# Patient Record
Sex: Female | Born: 1975 | Race: Black or African American | Hispanic: No | Smoking: Never smoker
Health system: Southern US, Community
[De-identification: ages and names within clinical notes are randomized; demographics above are authoritative.]

---

## 2012-09-15 ENCOUNTER — Emergency Department: Payer: Self-pay | Admitting: Emergency Medicine

## 2015-08-30 ENCOUNTER — Ambulatory Visit: Payer: 59 | Admitting: Family Medicine

## 2018-05-20 ENCOUNTER — Encounter: Payer: Self-pay | Admitting: *Deleted

## 2018-05-20 ENCOUNTER — Emergency Department: Payer: 59

## 2018-05-20 ENCOUNTER — Emergency Department
Admission: EM | Admit: 2018-05-20 | Discharge: 2018-05-21 | Disposition: A | Discharge status: Home / Self Care | Payer: 59 | Attending: Emergency Medicine | Admitting: Emergency Medicine

## 2018-05-20 ENCOUNTER — Other Ambulatory Visit: Payer: Self-pay

## 2018-05-20 DIAGNOSIS — H5440 Blindness, one eye, unspecified eye: Secondary | ICD-10-CM

## 2018-05-20 DIAGNOSIS — R4182 Altered mental status, unspecified: Secondary | ICD-10-CM | POA: Diagnosis present

## 2018-05-20 DIAGNOSIS — R200 Anesthesia of skin: Secondary | ICD-10-CM | POA: Insufficient documentation

## 2018-05-20 DIAGNOSIS — H53132 Sudden visual loss, left eye: Secondary | ICD-10-CM | POA: Diagnosis not present

## 2018-05-20 DIAGNOSIS — R410 Disorientation, unspecified: Secondary | ICD-10-CM | POA: Insufficient documentation

## 2018-05-20 LAB — BASIC METABOLIC PANEL
Anion gap: 8 (ref 5–15)
BUN: 12 mg/dL (ref 6–20)
CALCIUM: 9 mg/dL (ref 8.9–10.3)
CO2: 23 mmol/L (ref 22–32)
Chloride: 108 mmol/L (ref 101–111)
Creatinine, Ser: 0.76 mg/dL (ref 0.44–1.00)
GFR calc Af Amer: >60 mL/min (ref >60)
GLUCOSE: 99 mg/dL (ref 65–99)
Potassium: 4.1 mmol/L (ref 3.5–5.1)
Sodium: 139 mmol/L (ref 135–145)

## 2018-05-20 LAB — CBC
HCT: 39.3 % (ref 35.0–47.0)
Hemoglobin: 13.3 g/dL (ref 12.0–16.0)
MCH: 30.3 pg (ref 26.0–34.0)
MCHC: 34 g/dL (ref 32.0–36.0)
MCV: 89.3 fL (ref 80.0–100.0)
PLATELETS: 220 10*3/uL (ref 150–440)
RBC: 4.4 MIL/uL (ref 3.80–5.20)
RDW: 14 % (ref 11.5–14.5)
WBC: 4.5 10*3/uL (ref 3.6–11.0)

## 2018-05-20 LAB — URINE DRUG SCREEN, QUALITATIVE (ARMC ONLY)
Amphetamines, Ur Screen: NOT DETECTED
BARBITURATES, UR SCREEN: NOT DETECTED
BENZODIAZEPINE, UR SCRN: NOT DETECTED
COCAINE METABOLITE, UR ~~LOC~~: NOT DETECTED
Cannabinoid 50 Ng, Ur ~~LOC~~: NOT DETECTED
MDMA (Ecstasy)Ur Screen: NOT DETECTED
METHADONE SCREEN, URINE: NOT DETECTED
Opiate, Ur Screen: NOT DETECTED
Phencyclidine (PCP) Ur S: NOT DETECTED
TRICYCLIC, UR SCREEN: NOT DETECTED

## 2018-05-20 LAB — HEPATIC FUNCTION PANEL
ALBUMIN: 2 g/dL — AB (ref 3.5–5.0)
ALT: 15 U/L (ref 14–54)
AST: 40 U/L (ref 15–41)
Alkaline Phosphatase: 124 U/L (ref 38–126)
BILIRUBIN TOTAL: 0.3 mg/dL (ref 0.3–1.2)
Total Protein: 5.2 g/dL — ABNORMAL LOW (ref 6.5–8.1)

## 2018-05-20 LAB — ETHANOL: Alcohol, Ethyl (B): <10 mg/dL (ref <10)

## 2018-05-20 LAB — POCT PREGNANCY, URINE: Preg Test, Ur: NEGATIVE

## 2018-05-20 MED ORDER — LORAZEPAM 1 MG PO TABS
1.0000 mg | ORAL_TABLET | Freq: Once | ORAL | Status: AC
  Administered 2018-05-20: 1 mg via ORAL
  Filled 2018-05-20: qty 1

## 2018-05-20 MED ORDER — ASPIRIN 81 MG PO CHEW
324.0000 mg | CHEWABLE_TABLET | Freq: Once | ORAL | Status: AC
  Administered 2018-05-21: 324 mg via ORAL
  Filled 2018-05-20: qty 4

## 2018-05-20 MED ORDER — GADOBENATE DIMEGLUMINE 529 MG/ML IV SOLN: 10.0000 mL | Freq: Once | INTRAVENOUS | Status: DC | PRN

## 2018-05-20 NOTE — ED Triage Notes (Signed)
Pt brought in via ems with left eye blurred vision and heaviness on left side of face.  On arrival to er, sx resolved and pt crying.  Pt reports feeling stressed.

## 2018-05-20 NOTE — Consult Note (Signed)
[facility]   Asked to see this patient in telemedicine consultation. Consultation was performed with assistance of ancillary / medical staff at bedside. ?   Verbal consent to perform the examination with telemedicine was obtained. Patient and family agreed to proceed with the consultation.   Impression: Spell  Combination of symptoms including confusion/comprehension problems, L face numb, L eye visual disturbance  In setting of heightened stress  Non-focal exam currently  NIHSS 0  Not a tpa candidate due to:  Resolved symptoms  Not an NIR candidate due to:  Resolved symptoms  Differential Diagnosis:   Stroke  TIA  Seizure  Migraine  Stress/anxiety   Metrics:  STAT consult request  Recommendations:    Antiplatelet therapy with ASA OK  DVT Prophylaxis  Dysphagia Screen  MRI brain in ED  If negative study, then outpatient neuro referral  If positive study then admission for further work-up  Discussed with ED MD, they will call us back if MRI shows any concerning abnormality     CC:  Stroke Alert  Date of Consult:  05/20/18    HPI:  Elevated levels of stress recently at work  This was exacerbated with visit from her boss today  At about 1000 reports was reading at work and developed problems comprehending what she was reading  Felt confused/disoriented at times  Then noted L face numbness, ? Blurred vision OS  Tried to make tea when she got home, forgot to put water in tea [employer] a nap, and when up later in afternoon still some confusion, unsure of address or how she got home  To ER for further evaluation  In ER neuro exam normalized, and STAT neuro consult requested     No past medical history on file.  Prior to Admission medications   Not on File     No Known Allergies  Social History   Tobacco Use  . Smoking status: Never Smoker  . Smokeless tobacco: Never Used   Substance Use Topics  . Alcohol use: Not Currently  . Drug use: Never    No family history on file.      Physical Exam  Vitals:   05/20/18 1824  BP: (!) 133/93  Pulse: 100  Resp: 20  Temp: 98.6 F (37 C)  SpO2: 100%     NIHSS  LOC - 0  LOC questions - 0  LOC commands - 0  EOM - 0  VF - 0  Face - 0  Motor Upper Ext - 0  Motor Lower Ext - 0  Coordination - 0  Sensory - 0  Language - 0  Speech - 0  Extinction - 0  NIHSS total - 0   Lab/Data Review  CT Head - reviewed - no acute process      Medical Decision Making:  - Extensive number of diagnosis or management options are considered above.   - Extensive amount of complex data reviewed.   - High risk of complication and/or morbidity or mortality are associated with differential diagnostic considerations above.  - There may be Uncertain outcome and increased probability of prolonged functional impairment or high probability of severe prolonged functional impairment associated with some of these differential diagnosis.  Medical Data Reviewed:  1.Data reviewed include clinical labs, radiology,  Medical Tests;   2.Tests results discussed w/performing or interpreting physician;   3.Obtaining/reviewing old medical records;  4.Obtaining case history from another source;  5.Independent review of image, tracing or  specimen.

## 2018-05-20 NOTE — ED Notes (Signed)
Patient transported to MRI 

## 2018-05-20 NOTE — ED Provider Notes (Signed)
Patient's MRI was nondiagnostic, unable to perform with reasonable imaging due to some unremovable glued on hair clips for her leg.  The patient reports she is asymptomatic.  No ongoing symptoms.  I discussed with neurology, Dr. Myriam Jacobson, he recommends the patient may be discharged to utilize 325 mg aspirin daily.  Check with the patient, she is resting comfortably and she is agreeable with this plan.  No ongoing symptoms.  She will start aspirin daily, follow-up with outpatient neurology.  Return precautions and treatment recommendations and follow-up discussed with the patient who is agreeable with the plan.    Sharyn Creamer, MD 05/20/18 [phone]

## 2018-05-20 NOTE — ED Notes (Signed)
Report off to dejae rn  

## 2018-05-20 NOTE — Discharge Instructions (Addendum)
Please get plenty of rest, eat small healthy meals throughout the day, and drink plenty of fluid.  Please make a follow-up appointment with Dr. Malvin Johns, the neurologist on call.  Return to the emergency department if you develop severe headache, numbness tingling or weakness, difficulty walking, changes in vision, speech or mental status, or for any other symptoms concerning to you.  Take  of Aspirin by mouth daily (purchase at local pharmacy or grocer) and follow-up with neurology in the clinic (Dr. Sherryll Burger or Dr. Malvin Johns) as soon as posssible.

## 2018-05-20 NOTE — ED Notes (Signed)
Pt reports while at work today, pt had blurred vision in left eye and heaviness in left side of face.  No headache.  No n/v/d.  Pt also reports memory changes today but states now my memory is back.  Pt also states she has been stressed at work and is tearful now.  No chest pain or sob.  Pt alert.  Speech clear.

## 2018-05-20 NOTE — ED Provider Notes (Signed)
[facility] Emergency Department Provider Note  ____________________________________________  Time seen: Approximately 6:54 PM  I have reviewed the triage vital signs and the nursing notes.   HISTORY  Chief Complaint Altered Mental Status    HPI Stacey Hess is a 42 y.o. female, otherwise healthy, presenting for altered mental status and visual changes.  The patient reports that for the past several months she has been working with a very difficult client which has been very stressful.  She reports that for the past 2 weeks, she has had multiple headaches, which usually resolve with eating, drinking water, or rest.  She does not have a history of migraines.  Today, her boss flew into town and they were reviewing what to do with this client when the patient began to develop an inability to comprehend what she was reading.  She then fell up to complete loss of vision in the left eye which resolved.  She then developed left facial numbness, which has resolved.  She went home, and could not remember the name of her street.  When she arrived home, she plan to make tea but then forgot to put water in the pot.  Her symptoms did not improve after taking a nap.  She has not had any numbness tingling or weakness, speech changes, difficulty walking.  She has not recently had any trauma.  She denies drug abuse.  SH: Denies cocaine, illicit drug abuse, alcohol abuse.  No past medical history on file.  There are no active problems to display for this patient.       Allergies Patient has no known allergies.  No family history on file.  Social History Social History   Tobacco Use  . Smoking status: Never Smoker  . Smokeless tobacco: Never Used  Substance Use Topics  . Alcohol use: Not Currently  . Drug use: Never    Review of Systems Constitutional: No fever/chills.  Positive change in mental status. Eyes: Positive loss of vision in the left eye. ENT: No sore  throat. No congestion or rhinorrhea. Cardiovascular: Denies chest pain. Denies palpitations. Respiratory: Denies shortness of breath.  No cough. Gastrointestinal: No abdominal pain.  No nausea, no vomiting.  No diarrhea.  No constipation. Genitourinary: Negative for dysuria. Musculoskeletal: Negative for back pain. Skin: Negative for rash. Neurological: Negative for headaches.  Positive left facial numbness.  Positive difficulty understand what she is reading.  Positive altered mental status. Psychiatric:+ anxiety    ____________________________________________   PHYSICAL EXAM:  VITAL SIGNS: ED Triage Vitals  Enc Vitals Group     BP 05/20/18 1824 (!) 133/93     Pulse Rate 05/20/18 1824 100     Resp 05/20/18 1824 20     Temp 05/20/18 1824 98.6 F (37 C)     Temp Source 05/20/18 1824 Oral     SpO2 05/20/18 1824 100 %     Weight 05/20/18 1825 149 lb (67.6 kg)     Height 05/20/18 1825 5' (1.524 m)     Head Circumference --      Peak Flow --      Pain Score 05/20/18 1825 0     Pain Loc --      Pain Edu? --      Excl. in GC? --     Constitutional: Patient is tearful on my examination.  She is alert and oriented x3.  GCS is 15. Eyes: Conjunctivae are normal.  EOMI. No scleral icterus. Head: Atraumatic. Nose: No congestion/rhinnorhea. Mouth/Throat:  Mucous membranes are moist.  Neck: No stridor.  Supple.  No JVD.  No meningismus. Cardiovascular: Normal rate, regular rhythm. No murmurs, rubs or gallops.  Respiratory: Normal respiratory effort.  No accessory muscle use or retractions. Lungs CTAB.  No wheezes, rales or ronchi. Gastrointestinal: Soft, nontender and nondistended.  No guarding or rebound.  No peritoneal signs. Musculoskeletal: No LE edema. Neurologic: Alert and oriented 3. Speech is clear. Naming and repetition are intact. Face and smile symmetric. Tongue is midline.  EOMI.  PERRLA.  No vertical or horizontal nystagmus.. No pronator drift. 5 out of 5 grip, biceps,  triceps, hip flexors, plantar flexion and dorsiflexion. Normal sensation to light touch in the bilateral upper and lower extremities, and face. Normal heel-to-shin. Skin:  Skin is warm, dry and intact. No rash noted. Psychiatric: Tearful but no pressured speech.  Good insight.  ____________________________________________   LABS (all labs ordered are listed, but only abnormal results are displayed)  Labs Reviewed  HEPATIC FUNCTION PANEL - Abnormal; Notable for the following components:      Result Value   Total Protein 5.2 (*)    Albumin 2.0 (*)    Bilirubin, Direct <0.1 (*)    All other components within normal limits  CBC  BASIC METABOLIC PANEL  URINE DRUG SCREEN, QUALITATIVE ([facility] ONLY)  ETHANOL  POC URINE PREG, ED  POCT PREGNANCY, URINE   ____________________________________________  EKG  ED ECG REPORT I, Rockne Menghini, the attending physician, personally viewed and interpreted this ECG.   Date: 05/20/2018  EKG Time: 1841  Rate: 74  Rhythm: normal sinus rhythm  Axis: normal  Intervals:none  ST&T Change: No STEMI  ____________________________________________  RADIOLOGY  Ct Head Wo Contrast  Result Date: 05/20/2018 CLINICAL DATA:  Blurry vision at work today, LEFT facial heaviness. Transient memory loss. EXAM: CT HEAD WITHOUT CONTRAST TECHNIQUE: Contiguous axial images were obtained from the base of the skull through the vertex without intravenous contrast. COMPARISON:  None. FINDINGS: BRAIN: No intraparenchymal hemorrhage, mass effect nor midline shift. The ventricles and sulci are normal. No acute large vascular territory infarcts. No abnormal extra-axial fluid collections. Basal cisterns are patent. VASCULAR: Unremarkable. SKULL/SOFT TISSUES: No skull fracture. No significant soft tissue swelling. ORBITS/SINUSES: The included ocular globes and orbital contents are normal.The mastoid aircells and included paranasal sinuses are well-aerated. OTHER: None.  IMPRESSION: Normal noncontrast CT HEAD. Electronically Signed   By: Awilda Metro M.D.   On: 05/20/2018 19:17    ____________________________________________   PROCEDURES  Procedure(s) performed: None  Procedures  Critical Care performed: No ____________________________________________   INITIAL IMPRESSION / ASSESSMENT AND PLAN / ED COURSE  Pertinent labs & imaging results that were available during my care of the patient were reviewed by me and considered in my medical decision making (see chart for details).  42 y.o. female, otherwise healthy, presenting with an episode of difficulty with comprehension, memory loss, left facial numbness, and visual loss in the left eye, now all resolved.  On my examination, the patient is hemodynamically stable.  She does not have any focal neurologic deficits.  I am concerned about atypical migraine as the patient has been having symptoms for 2 weeks but the patient does not have a migrainous history.  In addition, will consider acute stroke which would be unlikely given her lack of risk factors.  Multiple sclerosis is also on the differential.  Anxiety from her stressful situation is also on the differential.  We will get basic laboratory studies, CT of the head, and  a neurology consult.  If the CT is negative, an MRI will follow.  ----------------------------------------- 7:08 PM on 05/20/2018 -----------------------------------------  I have spoken with the tele-neurologist on-call, who will also evaluate the patient.  If the patient's MRI is negative, anticipate discharge home with outpatient PCP and neurologic follow-up.  ----------------------------------------- 8:36 PM on 05/20/2018 -----------------------------------------  The patient has been signed out to Dr. Fanny Bien for f/u of MRI and final disposition.  ____________________________________________  FINAL CLINICAL IMPRESSION(S) / ED DIAGNOSES  Final diagnoses:  Disorientation   Left facial numbness  Blindness of left eye         NEW MEDICATIONS STARTED DURING THIS VISIT:  New Prescriptions   No medications on file      Rockne Menghini, MD 05/20/18 2037

## 2018-05-20 NOTE — ED Notes (Signed)
poct pregnancy Negative 

## 2018-06-08 ENCOUNTER — Emergency Department: Payer: 59

## 2018-06-08 ENCOUNTER — Emergency Department
Admission: EM | Admit: 2018-06-08 | Discharge: 2018-06-08 | Disposition: A | Discharge status: Home / Self Care | Payer: 59 | Attending: Emergency Medicine | Admitting: Emergency Medicine

## 2018-06-08 ENCOUNTER — Encounter: Payer: Self-pay | Admitting: Intensive Care

## 2018-06-08 ENCOUNTER — Other Ambulatory Visit: Payer: Self-pay

## 2018-06-08 DIAGNOSIS — R0789 Other chest pain: Secondary | ICD-10-CM | POA: Insufficient documentation

## 2018-06-08 DIAGNOSIS — R079 Chest pain, unspecified: Secondary | ICD-10-CM | POA: Diagnosis present

## 2018-06-08 LAB — BASIC METABOLIC PANEL
Anion gap: 5 (ref 5–15)
BUN: 8 mg/dL (ref 6–20)
CALCIUM: 8.4 mg/dL — AB (ref 8.9–10.3)
CO2: 20 mmol/L — AB (ref 22–32)
Chloride: 112 mmol/L — ABNORMAL HIGH (ref 101–111)
Creatinine, Ser: 0.69 mg/dL (ref 0.44–1.00)
GFR calc non Af Amer: >60 mL/min (ref >60)
Glucose, Bld: 99 mg/dL (ref 65–99)
Potassium: 3.8 mmol/L (ref 3.5–5.1)
SODIUM: 137 mmol/L (ref 135–145)

## 2018-06-08 LAB — CBC
HCT: 39 % (ref 35.0–47.0)
Hemoglobin: 13.3 g/dL (ref 12.0–16.0)
MCH: 30 pg (ref 26.0–34.0)
MCHC: 34 g/dL (ref 32.0–36.0)
MCV: 88.3 fL (ref 80.0–100.0)
Platelets: 211 10*3/uL (ref 150–440)
RBC: 4.41 MIL/uL (ref 3.80–5.20)
RDW: 14.1 % (ref 11.5–14.5)
WBC: 3.9 10*3/uL (ref 3.6–11.0)

## 2018-06-08 LAB — TROPONIN I: Troponin I: <0.03 ng/mL (ref <0.03)

## 2018-06-08 MED ORDER — IOPAMIDOL (ISOVUE-370) INJECTION 76%
75.0000 mL | Freq: Once | INTRAVENOUS | Status: AC | PRN
  Administered 2018-06-08: 75 mL via INTRAVENOUS

## 2018-06-08 NOTE — ED Notes (Signed)
Patient transported to CT 

## 2018-06-08 NOTE — Discharge Instructions (Signed)
Fortunately today your blood work and your CT scan were reassuring.  Please begin taking your baby aspirin every day and follow-up with primary care in 2 days for a recheck.  Return to the emergency department sooner for any concerns.  It was a pleasure to take care of you today, and thank you for coming to our emergency department.  If you have any questions or concerns before leaving please ask the nurse to grab me and I'm more than happy to go through your aftercare instructions again.  If you were prescribed any opioid pain medication today such as Norco, Vicodin, Percocet, morphine, hydrocodone, or oxycodone please make sure you do not drive when you are taking this medication as it can alter your ability to drive safely.  If you have any concerns once you are home that you are not improving or are in fact getting worse before you can make it to your follow-up appointment, please do not hesitate to call 911 and come back for further evaluation.  Merrily BrittleNeil Trev Boley, MD  Results for orders placed or performed during the hospital encounter of 06/08/18  Basic metabolic panel  Result Value Ref Range   Sodium 137 135 - 145 mmol/L   Potassium 3.8 3.5 - 5.1 mmol/L   Chloride 112 (H) 101 - 111 mmol/L   CO2 20 (L) 22 - 32 mmol/L   Glucose, Bld 99 65 - 99 mg/dL   BUN 8 6 - 20 mg/dL   Creatinine, Ser 1.470.69 0.44 - 1.00 mg/dL   Calcium 8.4 (L) 8.9 - 10.3 mg/dL   GFR calc non Af Amer >60 >60 mL/min   GFR calc Af Amer >60 >60 mL/min   Anion gap 5 5 - 15  CBC  Result Value Ref Range   WBC 3.9 3.6 - 11.0 K/uL   RBC 4.41 3.80 - 5.20 MIL/uL   Hemoglobin 13.3 12.0 - 16.0 g/dL   HCT 82.939.0 56.235.0 - 13.047.0 %   MCV 88.3 80.0 - 100.0 fL   MCH 30.0 26.0 - 34.0 pg   MCHC 34.0 32.0 - 36.0 g/dL   RDW 86.514.1 78.411.5 - 69.614.5 %   Platelets 211 150 - 440 K/uL  Troponin I  Result Value Ref Range   Troponin I <0.03 <0.03 ng/mL   Dg Chest 2 View  Result Date: 06/08/2018 CLINICAL DATA:  Chest pain EXAM: CHEST - 2 VIEW  COMPARISON:  None. FINDINGS: Lungs are clear. Heart size and pulmonary vascularity are normal. No adenopathy. No pneumothorax. No bone lesions. IMPRESSION: No edema or consolidation. Electronically Signed   By: Bretta BangWilliam  Woodruff III M.D.   On: 06/08/2018 10:51   Ct Head Wo Contrast  Result Date: 05/20/2018 CLINICAL DATA:  Blurry vision at work today, LEFT facial heaviness. Transient memory loss. EXAM: CT HEAD WITHOUT CONTRAST TECHNIQUE: Contiguous axial images were obtained from the base of the skull through the vertex without intravenous contrast. COMPARISON:  None. FINDINGS: BRAIN: No intraparenchymal hemorrhage, mass effect nor midline shift. The ventricles and sulci are normal. No acute large vascular territory infarcts. No abnormal extra-axial fluid collections. Basal cisterns are patent. VASCULAR: Unremarkable. SKULL/SOFT TISSUES: No skull fracture. No significant soft tissue swelling. ORBITS/SINUSES: The included ocular globes and orbital contents are normal.The mastoid aircells and included paranasal sinuses are well-aerated. OTHER: None. IMPRESSION: Normal noncontrast CT HEAD. Electronically Signed   By: Awilda Metroourtnay  Bloomer M.D.   On: 05/20/2018 19:17   Ct Angio Chest Pe W/cm &/or Wo Cm  Result Date: 06/08/2018 CLINICAL DATA:  Chest pain EXAM: CT ANGIOGRAPHY CHEST WITH CONTRAST TECHNIQUE: Multidetector CT imaging of the chest was performed using the standard protocol during bolus administration of intravenous contrast. Multiplanar CT image reconstructions and MIPs were obtained to evaluate the vascular anatomy. CONTRAST:  75mL ISOVUE-370 IOPAMIDOL (ISOVUE-370) INJECTION 76% COMPARISON:  Chest radiograph June 08, 2018 FINDINGS: Cardiovascular: There is no demonstrable pulmonary embolus. There is no thoracic aortic aneurysm or dissection. Visualized great vessels appear normal. Note that the right innominate and left common carotid arteries arise as a common trunk, an anatomic variant. There is no  pericardial effusion or pericardial thickening evident. Mediastinum/Nodes: Visualized thyroid appears unremarkable. There is no appreciable thoracic adenopathy. There is no appreciable esophageal lesion evident on this study. Lungs/Pleura: There is no appreciable edema or consolidation. No pleural effusion or pleural thickening evident. Upper Abdomen: Visualized upper abdominal structures appear unremarkable. Musculoskeletal: There are no blastic or lytic bone lesions. No chest wall lesions are evident. Review of the MIP images confirms the above findings. IMPRESSION: 1. No demonstrable pulmonary embolus. No thoracic aortic aneurysm or dissection. 2.  No lung edema or consolidation.  No pleural effusion. 3.  No evident thoracic adenopathy. Electronically Signed   By: Bretta Bang III M.D.   On: 06/08/2018 12:09   Mr Brain Wo Contrast  Result Date: 05/20/2018 CLINICAL DATA:  Altered mental status and visual changes, evaluate subacute neuro deficits. Transient memory loss and LEFT facial numbness. EXAM: MRI HEAD WITHOUT CONTRAST TECHNIQUE: Multiplanar, multiecho pulse sequences of the brain and surrounding structures were obtained without intravenous contrast. COMPARISON:  CT HEAD May 20, 2018 FINDINGS: Nondiagnostic examination due to susceptibility artifact related to patient's hair. Per technologist, these were unable to be removed. IMPRESSION: 1. Nondiagnostic MRI head. Repeat examination could be performed after removal of metallic foreign bodies within patient's hair. Electronically Signed   By: Awilda Metro M.D.   On: 05/20/2018 21:35

## 2018-06-08 NOTE — ED Triage Notes (Signed)
[facility] Patient was with client and started having heavy midsternal chest pain that radiates down L arm. EMS administered aspirin. Blood sugar from EMS 119, b/p 132/79, 100 O2, pulse 86. Patient A&O x4. HX TIA X3 weeks ago

## 2018-06-08 NOTE — ED Provider Notes (Signed)
[facility] Emergency Department Provider Note  ____________________________________________   First MD Initiated Contact with Patient 06/08/18 1107     (approximate)  I have reviewed the triage vital signs and the nursing notes.   HISTORY  Chief Complaint Chest Pain    HPI Grace BlightKarema Garzon is a 42 y.o. female who comes to the emergency department via EMS with multiple issues.  She said that while at work today she was walking and she had sudden onset moderate severity substernal chest pain that seems to radiate towards her back and then towards her right arm.  The pain lasted for several minutes at a time however the numbness and tingling in her right arm persisted.  She is currently in no pain.  She denies headache.  She was recently diagnosed with a TIA 3 weeks ago however has been noncompliant with her aspirin taking over-the-counter vitamins and herbs instead.  She currently takes no medications.  Her pain was not ripping or tearing.  She did have some mild shortness of breath.  No recent surgery travel or immobilization.  No history of DVT or pulmonary embolism but she has reported recent "swelling and tingling" in her right leg.  History reviewed. No pertinent past medical history.  There are no active problems to display for this patient.   History reviewed. No pertinent surgical history.  Prior to Admission medications   Not on File    Allergies Patient has no known allergies.  History reviewed. No pertinent family history.  Social History Social History   Tobacco Use  . Smoking status: Never Smoker  . Smokeless tobacco: Never Used  Substance Use Topics  . Alcohol use: Not Currently  . Drug use: Never    Review of Systems Constitutional: No fever/chills Eyes: No visual changes. ENT: No sore throat. Cardiovascular: Positive for chest pain. Respiratory: Denies shortness of breath. Gastrointestinal: No abdominal pain.  No nausea, no  vomiting.  No diarrhea.  No constipation. Genitourinary: Negative for dysuria. Musculoskeletal: Negative for back pain. Skin: Negative for rash. Neurological: Negative for headaches  ____________________________________________   PHYSICAL EXAM:  VITAL SIGNS: ED Triage Vitals  Enc Vitals Group     BP 06/08/18 1037 121/75     Pulse Rate 06/08/18 1037 70     Resp 06/08/18 1037 10     Temp 06/08/18 1037 98.5 F (36.9 C)     Temp Source 06/08/18 1037 Oral     SpO2 06/08/18 1037 99 %     Weight 06/08/18 1034 149 lb (67.6 kg)     Height 06/08/18 1034 5' (1.524 m)     Head Circumference --      Peak Flow --      Pain Score 06/08/18 1034 3     Pain Loc --      Pain Edu? --      Excl. in GC? --     Constitutional: Alert and oriented x4 somewhat anxious appearing nontoxic no diaphoresis speaks in full clear sentences Eyes: PERRL EOMI. Head: Atraumatic. Nose: No congestion/rhinnorhea. Mouth/Throat: No trismus Neck: No stridor.   Cardiovascular: Normal rate, regular rhythm. Grossly normal heart sounds.  Good peripheral circulation. Respiratory: Normal respiratory effort.  No retractions. Lungs CTAB and moving good air Gastrointestinal: Soft nontender Musculoskeletal: No lower extremity edema   Neurologic:  Normal speech and language. No gross focal neurologic deficits are appreciated. Skin:  Skin is warm, dry and intact. No rash noted. Psychiatric: Anxious appearing    ____________________________________________  DIFFERENTIAL includes but not limited to  Acute coronary syndrome, pulmonary embolism, aortic dissection ____________________________________________   LABS (all labs ordered are listed, but only abnormal results are displayed)  Labs Reviewed  BASIC METABOLIC PANEL - Abnormal; Notable for the following components:      Result Value   Chloride 112 (*)    CO2 20 (*)    Calcium 8.4 (*)    All other components within normal limits  CBC  TROPONIN I  POC  URINE PREG, ED    Lab work reviewed by me with no acute ischemia noted CT chest reviewed by me with no dissection and no pulmonary embolism __________________________________________  EKG  ED ECG REPORT I, Merrily Brittle, the attending physician, personally viewed and interpreted this ECG.  Date: 06/08/2018 EKG Time:  Rate: 75 Rhythm: normal sinus rhythm QRS Axis: normal Intervals: normal ST/T Wave abnormalities: normal Narrative Interpretation: no evidence of acute ischemia  ____________________________________________  RADIOLOGY  Chest x-ray reviewed by me with no acute disease ____________________________________________   PROCEDURES  Procedure(s) performed: no  Procedures  Critical Care performed: no  Observation: no ____________________________________________   INITIAL IMPRESSION / ASSESSMENT AND PLAN / ED COURSE  Pertinent labs & imaging results that were available during my care of the patient were reviewed by me and considered in my medical decision making (see chart for details).  Unclear etiology of the patient's symptoms.  Exertional substernal chest pain certainly is concerning and I think she warrants at least 2 troponins that she came in immediately.  Given her leg swelling and neuro findings along with some shortness of breath I think a CT angios was also warranted which would evaluate for dissection and pulmonary embolism.     ----------------------------------------- 12:30 PM on 06/08/2018 -----------------------------------------  Fortunately the patient's imaging is negative and her troponin is negative as well.  She feels significant relief.  She says that she has been under tremendous amount of stress at work and she is concerned this could be an anxiety reaction which I actually agree with.  She does have the ability to follow-up with her primary care physician this coming week for recheck.  She is discharged home in improved condition  verbalizes understanding agree with the plan with strict return precautions given. ____________________________________________   FINAL CLINICAL IMPRESSION(S) / ED DIAGNOSES  Final diagnoses:  Atypical chest pain      NEW MEDICATIONS STARTED DURING THIS VISIT:  New Prescriptions   No medications on file     Note:  This document was prepared using Dragon voice recognition software and may include unintentional dictation errors.     Merrily Brittle, MD 06/08/18 1231

## 2018-06-08 NOTE — ED Notes (Signed)
Patient transported to X-ray 

## 2018-12-29 ENCOUNTER — Emergency Department
Admission: EM | Admit: 2018-12-29 | Discharge: 2018-12-29 | Disposition: A | Discharge status: Home / Self Care | Payer: 59 | Attending: Emergency Medicine | Admitting: Emergency Medicine

## 2018-12-29 ENCOUNTER — Other Ambulatory Visit: Payer: Self-pay

## 2018-12-29 ENCOUNTER — Encounter: Payer: Self-pay | Admitting: Emergency Medicine

## 2018-12-29 DIAGNOSIS — K0889 Other specified disorders of teeth and supporting structures: Secondary | ICD-10-CM | POA: Insufficient documentation

## 2018-12-29 MED ORDER — IBUPROFEN 600 MG PO TABS: 600.0000 mg | ORAL_TABLET | Freq: Three times a day (TID) | ORAL | 0 refills | Status: AC | PRN

## 2018-12-29 MED ORDER — BUPIVACAINE HCL (PF) 0.5 % IJ SOLN
30.0000 mL | Freq: Once | INTRAMUSCULAR | Status: AC
  Administered 2018-12-29: 30 mL
  Filled 2018-12-29: qty 30

## 2018-12-29 MED ORDER — OXYCODONE-ACETAMINOPHEN 5-325 MG PO TABS
1.0000 | ORAL_TABLET | ORAL | Status: DC | PRN
  Administered 2018-12-29: 1 via ORAL
  Filled 2018-12-29: qty 1

## 2018-12-29 MED ORDER — PENICILLIN V POTASSIUM 500 MG PO TABS: 500.0000 mg | ORAL_TABLET | Freq: Four times a day (QID) | ORAL | 0 refills | Status: AC

## 2018-12-29 MED ORDER — IBUPROFEN 600 MG PO TABS
600.0000 mg | ORAL_TABLET | Freq: Once | ORAL | Status: AC
  Administered 2018-12-29: 600 mg via ORAL
  Filled 2018-12-29: qty 1

## 2018-12-29 MED ORDER — OXYCODONE-ACETAMINOPHEN 5-325 MG PO TABS: 1.0000 | ORAL_TABLET | ORAL | 0 refills | Status: AC | PRN

## 2018-12-29 NOTE — ED Notes (Addendum)
Pt to the er for upper left tooth pain for 5 hours. Pt states she went to the MD two months ago and she was supposed to have surgery but did not. Pt then reports dizziness. Pt was brought to room in a wheelchair.

## 2018-12-29 NOTE — ED Provider Notes (Signed)
[facility] Emergency Department Provider Note  ____________________________________________   First MD Initiated Contact with Patient 12/29/18 [phone]     (approximate)  I have reviewed the triage vital signs and the nursing notes.   HISTORY  Chief Complaint Dental Pain    HPI Stacey Hess is a 43 y.o. female is L presents to the emergency department with sudden onset severe left upper dental pain that began acutely 5 hours ago.  She has had chronic issues with her teeth and is scheduled to follow-up with a dentist later on this week.  She is had no shortness of breath.  No drooling.  No difficulty eating or drinking although cold certainly makes her symptoms worse.  No fevers or chills.   History reviewed. No pertinent past medical history.  There are no active problems to display for this patient.   History reviewed. No pertinent surgical history.  Prior to Admission medications   Medication Sig Start Date End Date Taking? Authorizing Provider  ibuprofen (ADVIL,MOTRIN) 600 MG tablet Take 1 tablet (600 mg total) by mouth every 8 (eight) hours as needed. 12/29/18   Merrily Brittle, MD  oxyCODONE-acetaminophen (PERCOCET/ROXICET) 5-325 MG tablet Take 1 tablet by mouth every 4 (four) hours as needed for severe pain. 12/29/18   Merrily Brittle, MD  penicillin v potassium (VEETID) 500 MG tablet Take 1 tablet (500 mg total) by mouth 4 (four) times daily for 10 days. 12/29/18 01/08/19  Merrily Brittle, MD    Allergies Peanut-containing drug products  No family history on file.  Social History Social History   Tobacco Use  . Smoking status: Never Smoker  . Smokeless tobacco: Never Used  Substance Use Topics  . Alcohol use: Not Currently  . Drug use: Never    Review of Systems Constitutional: No fever/chills ENT: Positive for dental pain Cardiovascular: Denies chest pain. Respiratory: Denies shortness of breath. Gastrointestinal: No abdominal pain.  No  nausea, no vomiting.   Neurological: Negative for headaches   ____________________________________________   PHYSICAL EXAM:  VITAL SIGNS: ED Triage Vitals  Enc Vitals Group     BP 12/29/18 0131 (!) 122/91     Pulse Rate 12/29/18 0131 85     Resp 12/29/18 0131 20     Temp 12/29/18 0131 98.6 F (37 C)     Temp Source 12/29/18 0131 Oral     SpO2 12/29/18 0131 100 %     Weight 12/29/18 0133 139 lb (63 kg)     Height 12/29/18 0133 5\' 1"  (1.549 m)     Head Circumference --      Peak Flow --      Pain Score 12/29/18 0133 10     Pain Loc --      Pain Edu? --      Excl. in GC? --     Constitutional: Alert and oriented x4 tearful and miserable appearing Head: Atraumatic. Nose: No congestion/rhinnorhea. Mouth/Throat: No trismus uvula midline no pharyngeal erythema or exudate.  Left upper posterior molar cracked and exquisitely tender to percussion.  No sublingual or submental tenderness or fullness.  No evidence of posterior infection Neck: No stridor.   Respiratory: Normal respiratory effort.  No retractions. Neurologic:  Normal speech and language. No gross focal neurologic deficits are appreciated.  Skin:  Skin is warm, dry and intact. No rash noted.    ____________________________________________  LABS (all labs ordered are listed, but only abnormal results are displayed)  Labs Reviewed - No data to display  __________________________________________  EKG   ____________________________________________  RADIOLOGY   ____________________________________________   DIFFERENTIAL includes but not limited to  Ludwig's angina, pericarditis, dental infection, broken tooth   PROCEDURES  Procedure(s) performed: Yes  .Nerve Block Date/Time: 12/29/2018 3:30 AM Performed by: Merrily Brittle, MD Authorized by: Merrily Brittle, MD   Consent:    Consent obtained:  Verbal   Consent given by:  Patient   Risks discussed:  Allergic reaction, bleeding, intravenous  injection, pain and unsuccessful block   Alternatives discussed:  Alternative treatment Indications:    Indications:  Pain relief Location:    Body area:  Head   Head nerve blocked: Left infra orbital nerve block.   Laterality:  Left Skin anesthesia (see MAR for exact dosages):    Skin anesthesia method:  None Procedure details (see MAR for exact dosages):    Block needle gauge:  25 G   Anesthetic injected:  Bupivacaine 0.5% w/o epi   Steroid injected:  None   Additive injected:  None   Injection procedure:  Anatomic landmarks identified, anatomic landmarks palpated, negative aspiration for blood and incremental injection   Paresthesia:  Immediately resolved Post-procedure details:    Dressing:  None   Outcome:  Pain relieved Comments:     Total of 3 cc used to and and infraorbital nerve block and 1 directly at the base of the tooth    Critical Care performed: no  ____________________________________________   INITIAL IMPRESSION / ASSESSMENT AND PLAN / ED COURSE  Pertinent labs & imaging results that were available during my care of the patient were reviewed by me and considered in my medical decision making (see chart for details).   As part of my medical decision making, I reviewed the following data within the electronic MEDICAL RECORD NUMBER History obtained from family if available, nursing notes, old chart and ekg, as well as notes from prior ED visits.  The patient comes to the emergency department with acute exacerbation of chronic dental pain.  I performed a dental block with near complete anesthesia.  I will prescribe her a short course of ibuprofen, Percocet, and penicillin VK and refer her back to the dentist.  No evidence of posterior involvement or Ludwig angina etc.  She was able to eat and drink prior to discharge.      ____________________________________________   FINAL CLINICAL IMPRESSION(S) / ED DIAGNOSES  Final diagnoses:  Pain, dental      NEW  MEDICATIONS STARTED DURING THIS VISIT:  Discharge Medication List as of 12/29/2018  3:27 AM    START taking these medications   Details  ibuprofen (ADVIL,MOTRIN) 600 MG tablet Take 1 tablet (600 mg total) by mouth every 8 (eight) hours as needed., Starting Wed 12/29/2018, Print    oxyCODONE-acetaminophen (PERCOCET/ROXICET) 5-325 MG tablet Take 1 tablet by mouth every 4 (four) hours as needed for severe pain., Starting Wed 12/29/2018, Print    penicillin v potassium (VEETID) 500 MG tablet Take 1 tablet (500 mg total) by mouth 4 (four) times daily for 10 days., Starting Wed 12/29/2018, Until Sat 01/08/2019, Print         Note:  This document was prepared using Dragon voice recognition software and may include unintentional dictation errors.     Merrily Brittle, MD 01/01/19 [phone]

## 2018-12-29 NOTE — ED Triage Notes (Signed)
Patient to ER for dental pain. Patient tearful in triage, states she has exposed tooth. Was seen at dentist and given 2 medications (Prednisone and unknown second medication). Patient states Prednisone made her heart feel like it was racing, and second medicine "made me feel like I was going to die".

## 2020-04-12 IMAGING — CT CT ANGIO CHEST
2 of 6 series · 18 of 46 positions shown · IV contrast (APPLIED)
Comparison: Chest radiograph June 08, 2018

CLINICAL DATA: Chest pain

EXAM:
CT ANGIOGRAPHY CHEST WITH CONTRAST
TECHNIQUE: Multidetector CT imaging of the chest was performed using the
standard protocol during bolus administration of intravenous
contrast. Multiplanar CT image reconstructions and MIPs were
obtained to evaluate the vascular anatomy.
CONTRAST:  75mL [id number] IOPAMIDOL ([id number]) INJECTION 76%

[Series 5: thins · axial · 0.60mm/px · z∈[-258,-58]mm · 15 of 220 slices shown]
[im 10/220  lung]
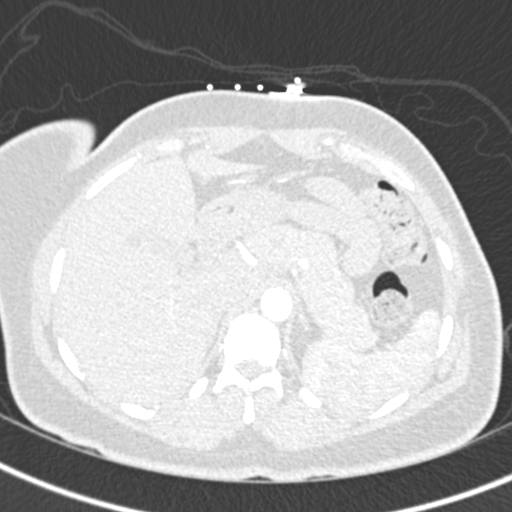
[im 29/220  soft-tissue]
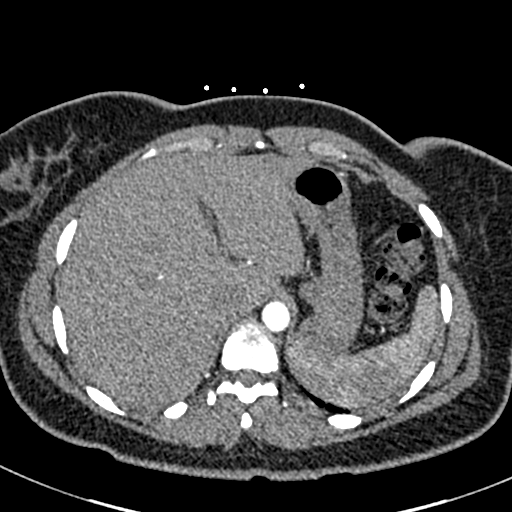
[im 39/220  lung]
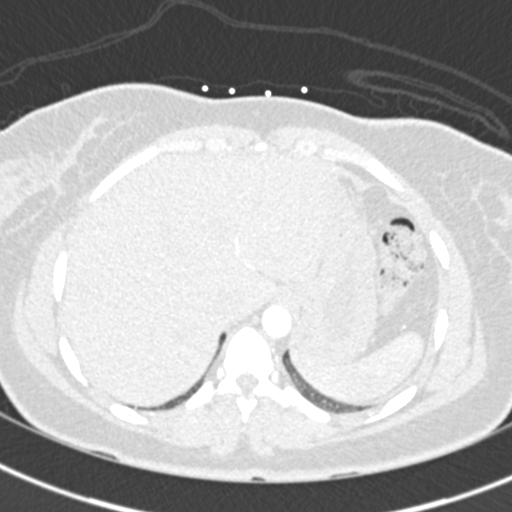
[im 58/220  soft-tissue]
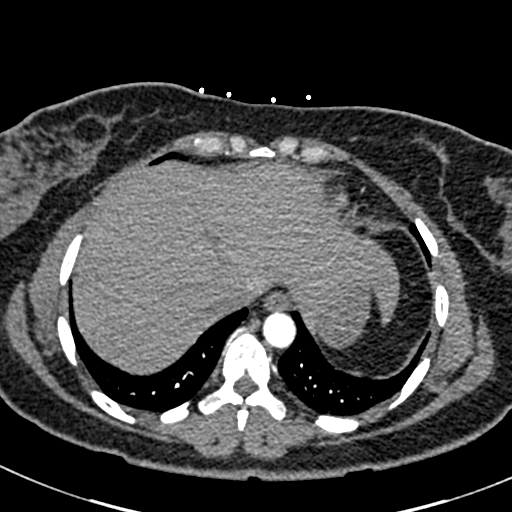
[im 67/220  lung]
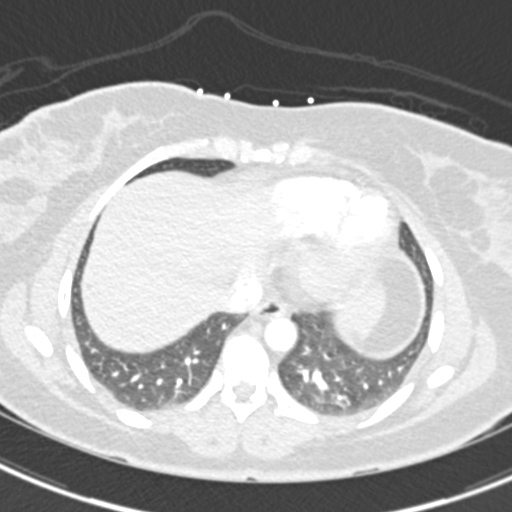
[im 86/220  soft-tissue]
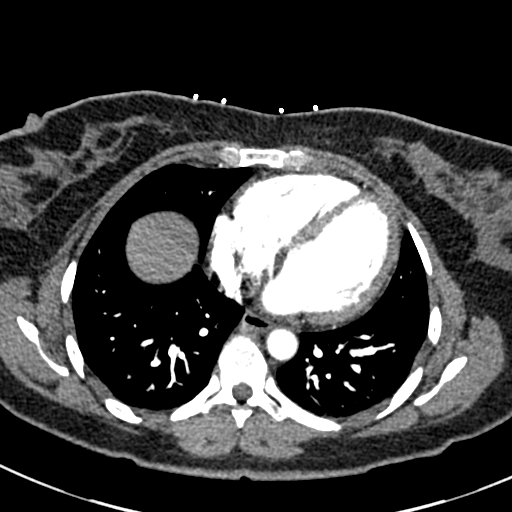
[im 96/220  lung]
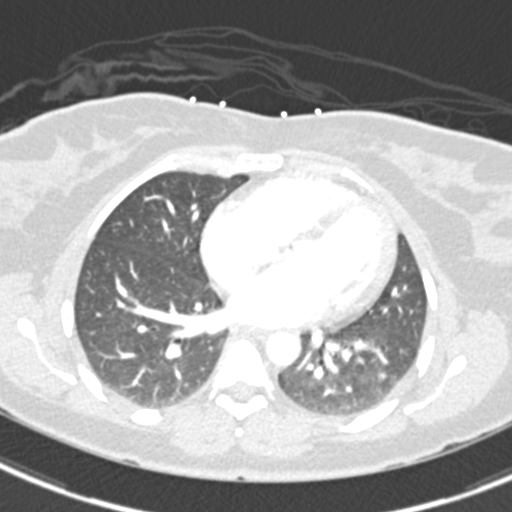
[im 115/220  soft-tissue]
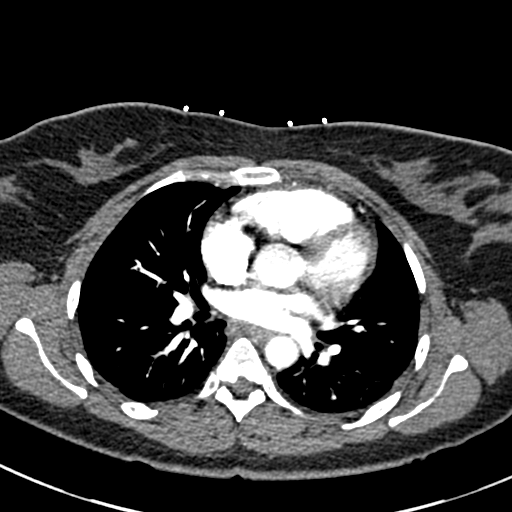
[im 124/220  lung]
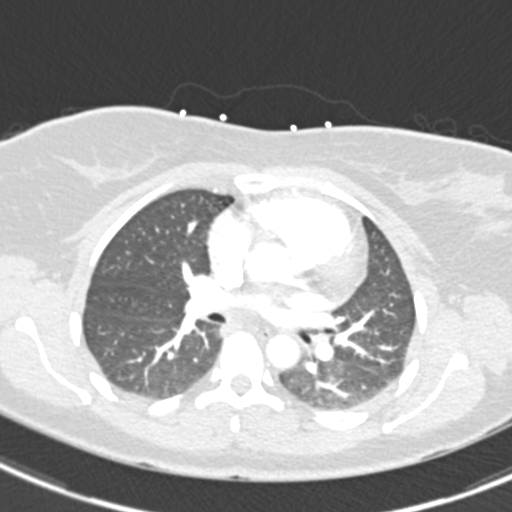
[im 134/220  soft-tissue]
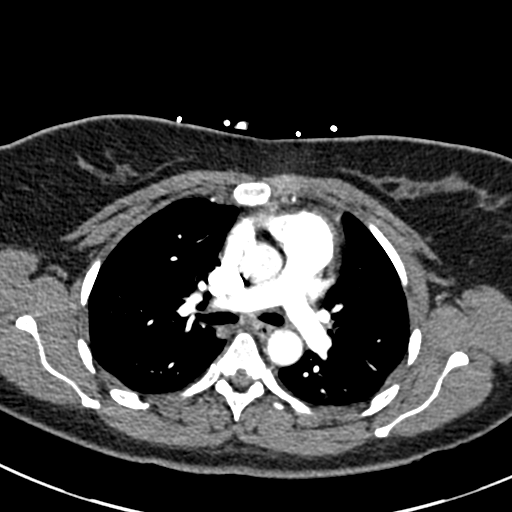
[im 153/220  lung]
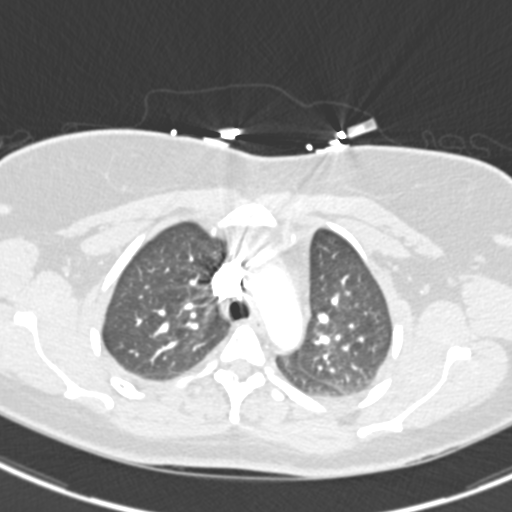
[im 162/220  soft-tissue]
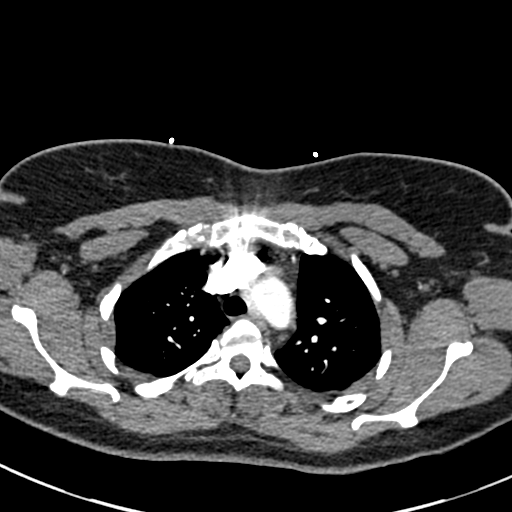
[im 181/220  lung]
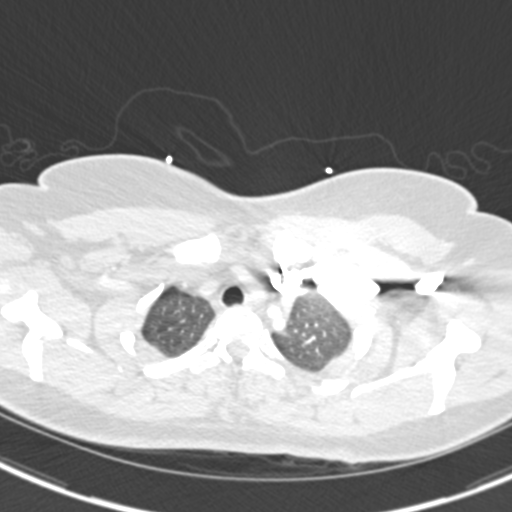
[im 191/220  soft-tissue]
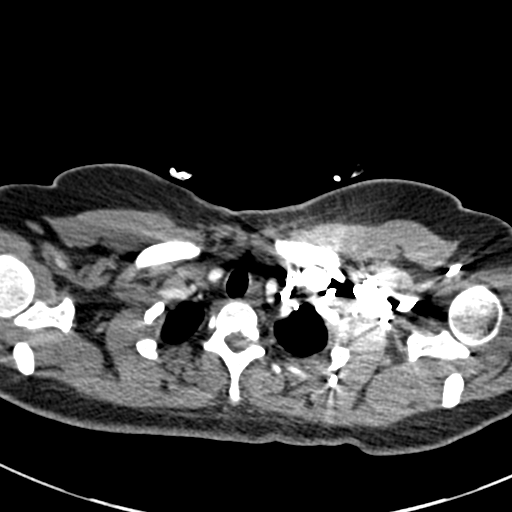
[im 210/220  lung]
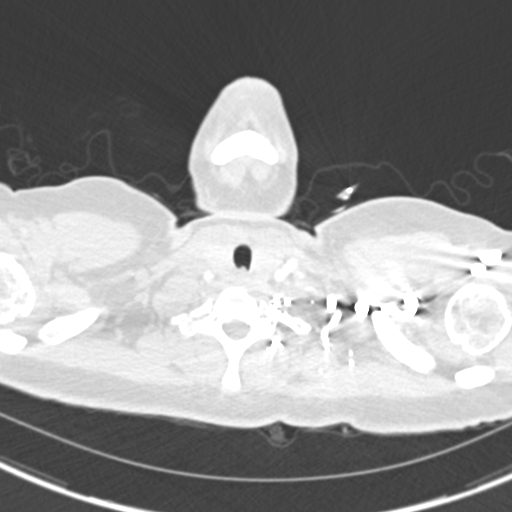

[Series 7: coronal mpr · coronal · 0.45mm/px · 3 of 71 slices shown]
[im 18/71  soft-tissue]
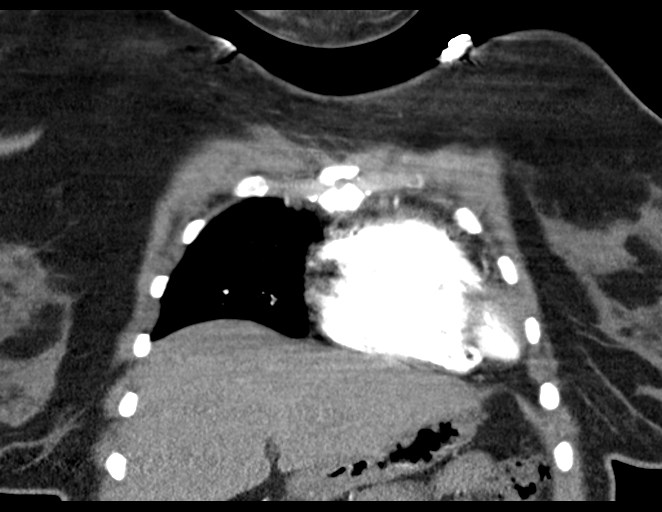
[im 36/71  soft-tissue]
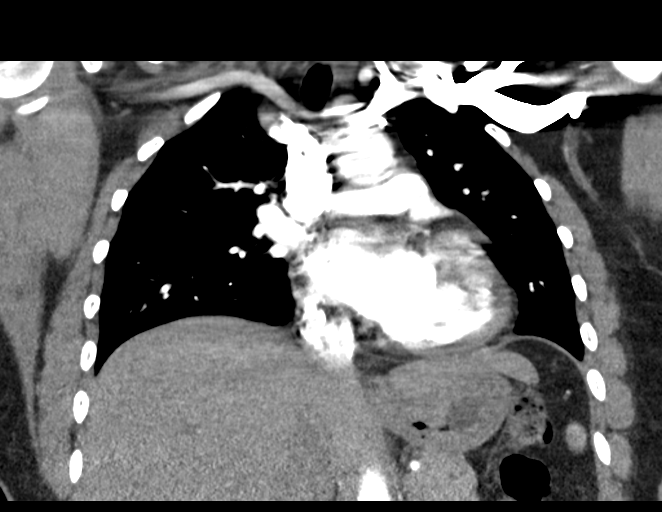
[im 53/71  soft-tissue]
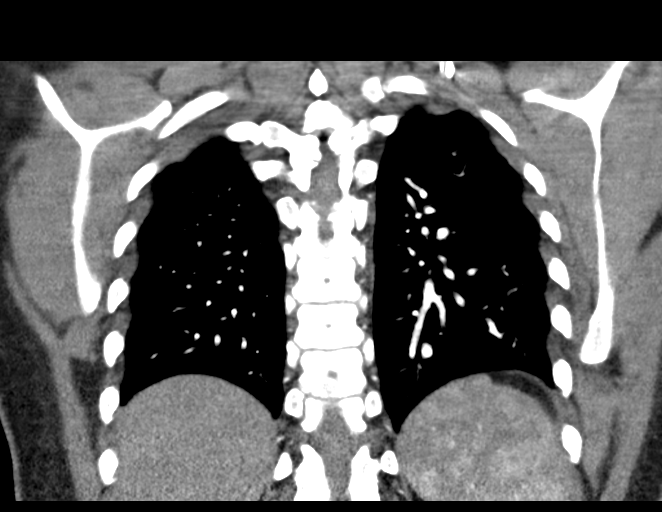

[18 of 46 positions shown; findings below may reference images not displayed]

FINDINGS: Cardiovascular: There is no demonstrable pulmonary embolus. There is
no thoracic aortic aneurysm or dissection. Visualized great vessels
appear normal. Note that the right innominate and left common
carotid arteries arise as a common trunk, an anatomic variant. There
is no pericardial effusion or pericardial thickening evident.

Mediastinum/Nodes: Visualized thyroid appears unremarkable. There is
no appreciable thoracic adenopathy. There is no appreciable
esophageal lesion evident on this study.

Lungs/Pleura: There is no appreciable edema or consolidation. No
pleural effusion or pleural thickening evident.

Upper Abdomen: Visualized upper abdominal structures appear
unremarkable.

Musculoskeletal: There are no blastic or lytic bone lesions. No
chest wall lesions are evident.

Review of the MIP images confirms the above findings.
IMPRESSION: 1. No demonstrable pulmonary embolus. No thoracic aortic aneurysm or
dissection.

2.  No lung edema or consolidation.  No pleural effusion.

3.  No evident thoracic adenopathy.

## 2020-08-28 ENCOUNTER — Ambulatory Visit: Payer: Self-pay

## 2020-08-29 ENCOUNTER — Ambulatory Visit: Payer: Self-pay

## 2020-11-14 ENCOUNTER — Other Ambulatory Visit: Payer: Self-pay

## 2020-11-14 ENCOUNTER — Ambulatory Visit
Admission: EM | Admit: 2020-11-14 | Discharge: 2020-11-14 | Disposition: A | Discharge status: Other Acute Inpatient Hospital | Payer: Self-pay | Attending: Emergency Medicine | Admitting: Emergency Medicine

## 2020-11-14 ENCOUNTER — Encounter: Payer: Self-pay | Admitting: Emergency Medicine

## 2020-11-14 DIAGNOSIS — M79601 Pain in right arm: Secondary | ICD-10-CM | POA: Insufficient documentation

## 2020-11-14 DIAGNOSIS — M79651 Pain in right thigh: Secondary | ICD-10-CM | POA: Insufficient documentation

## 2020-11-14 DIAGNOSIS — R079 Chest pain, unspecified: Secondary | ICD-10-CM | POA: Insufficient documentation

## 2020-11-14 LAB — GLUCOSE, CAPILLARY: Glucose-Capillary: 86 mg/dL (ref 70–99)

## 2020-11-14 NOTE — ED Triage Notes (Signed)
Patient c/o right arm pain, shortness of breath and chest pain that happened x 1 this morning. She states she ignored the pain as it only lasted about 1 minute. She states it happened again, and then this evening it happened while she was driving. She states now she has right leg pain, right arm pain and shortness of breath. She stated the shortness of breath was so bad she could not walk to the stairs. She didn't think she could drive to the ER so she came to [facility].

## 2020-11-14 NOTE — ED Provider Notes (Signed)
HPI  SUBJECTIVE:  Stacey Hess is a 44 y.o. female who presents with a acute onset of severe, deep squeezing medial right thigh pain that traveled down to her foot, and subsequently shot up into her chest and right arm.  She reports squeezing chest pain with shortness of breath and an impending sense of doom.  The lasted approximately a minute and resolved.  This occurred while shopping.  It was not during heavy exertion.  She states that she had another episode of similar pain that lasted about 4 minutes while driving.  Patient states that she was unable to make it to the ED and so came to the urgent care for evaluation.  She reports a squeezing, pulling abdominal pain as well.  She states that she felt faint with the arm and chest pain.  She currently denies right arm or chest pain, but reports some residual right medial thigh pain.  No leg swelling, known extremity color changes.  No syncope, palpitations, nausea, diaphoresis.  No recent prolonged immobilization, surgery in the past 4 weeks, hemoptysis.  No exertional component to this pain.  She has not tried anything for this.  Symptoms are better with sitting, standing, bending forward and applying pressure to her thigh.  No aggravating factors.  She denies any change in physical activity or trauma to her chest or leg.  She has a past medical history of TIA and is not on any aspirin.  She denies history of aortic dissection, PE, DVT, OCP use, cancer, atrial fibrillation, Marfan's, Ehlers-Danlos or other connective tissue disease, GERD, peripheral arterial disease, peripheral vascular disease.  Family history significant for mother with 6 MIs, fatal at 64.  She has had multiple maternal aunts with MIs.  States that she had maternal aunts with recurrent DVT.  PMD: None.  Patient was seen in the ED on 05/2018 for substernal exertional chest pain that radiated to her back and right arm and leg pain/shortness of breath.  She had negative CTA-P for  dissection or PE, had 2 normal troponins.  She states this feels somewhat similar to that pain.  Her primary concern is that she is having a blood clot or heart attack.  States that she is not under any amount of stress in her life right now.  He also states that she recently finished a 7-day fast and is wondering if this could be due to hypoglycemia.   History reviewed. No pertinent past medical history.  History reviewed. No pertinent surgical history.  Family History  Problem Relation Age of Onset  . Coronary artery disease Mother   . Healthy Father     Social History   Tobacco Use  . Smoking status: Never Smoker  . Smokeless tobacco: Never Used  Substance Use Topics  . Alcohol use: Not Currently  . Drug use: Never    No current facility-administered medications for this encounter.  Current Outpatient Medications:  .  ibuprofen (ADVIL,MOTRIN) 600 MG tablet, Take 1 tablet (600 mg total) by mouth every 8 (eight) hours as needed., Disp: 30 tablet, Rfl: 0 .  oxyCODONE-acetaminophen (PERCOCET/ROXICET) 5-325 MG tablet, Take 1 tablet by mouth every 4 (four) hours as needed for severe pain., Disp: 7 tablet, Rfl: 0  Allergies  Allergen Reactions  . Peanut-Containing Drug Products Anaphylaxis     ROS  As noted in HPI.   Physical Exam  BP 112/82 (BP Location: Right Arm)   Pulse 86   Resp 18   Ht 5\' 1"  (1.549 m)  Wt 64.4 kg   LMP 10/31/2020   SpO2 100%   BMI 26.83 kg/m   Constitutional: Well developed, well nourished, no acute distress Eyes: PERRL, EOMI, conjunctiva normal bilaterally HENT: Normocephalic, atraumatic,mucus membranes moist Respiratory: Clear to auscultation bilaterally, no rales, no wheezing, no rhonchi.  Positive right-sided chest wall tenderness. Cardiovascular: Normal rate and rhythm, no murmurs, no gallops, no rubs.  RP 2+ and equal bilaterally.  Present femoral pulses. GI: Mild periumbilical tenderness.  No palpable pulsatile abdominal masses.   Soft, nondistended, normal bowel sounds, nontender, no rebound, no guarding Back: no CVAT skin: No rash, skin intact Musculoskeletal: Tenderness right medial thigh.  No appreciable cord.  No distal edema.  No calf tenderness.  Calves symmetric.  DP right lower extremity 2+. Neurologic: Alert & oriented x 3, CN III-XII grossly intact, no motor deficits, sensation grossly intact Psychiatric: Speech and behavior appropriate   ED Course   Medications - No data to display  Orders Placed This Encounter  Procedures  . Glucose, capillary    Standing Status:   Standing    Number of Occurrences:   1  . CBG monitoring, ED    Standing Status:   Standing    Number of Occurrences:   1  . EKG 12-Lead    Standing Status:   Standing    Number of Occurrences:   1  . ED EKG    Standing Status:   Standing    Number of Occurrences:   1    Order Specific Question:   Reason for Exam    Answer:   Chest Pain   Results for orders placed or performed during the hospital encounter of 11/14/20 (from the past 24 hour(s))  Glucose, capillary     Status: None   Collection Time: 11/14/20  7:22 PM  Result Value Ref Range   Glucose-Capillary 86 70 - 99 mg/dL   No results found.  ED Clinical Impression  1. Chest pain, unspecified type   2. Right thigh pain   3. Right arm pain      ED Assessment/Plan  Previous ER notes, labs reviewed.  As noted in HPI.  EKG: Normal sinus rhythm, rate 62.  Normal axis, normal intervals.  No hypertrophy.  No ST-T wave changes.  No change compared to EKG from 06/09/2018.  Patient was asymptomatic while EKG was obtained.  Patient with severe leg pain, chest pain and arm pain.  She has multiple concerning historical factors.  Her EKG is normal, but she was asymptomatic while the EKG was obtained.  She is not tachycardic which would be concerning for PE, however given her medial thigh tenderness that traveled to the chest DVT/PE is in the differential.  Also consider aortic  dissection.  Transferring to the [facility] emergency department via EMS for cardiac monitoring on route and in case this happens again.  I do not feel that the patient would be able to drive should she have another episode of this.  She lives by herself here and does not have anyone to drive her.  Her vitals are otherwise normal.  Glucose is normal.  Discussed MDM, rationale for transfer to the emergency department with the patient. patient agrees with plan.   No orders of the defined types were placed in this encounter.   *This clinic note was created using Dragon dictation software. Therefore, there may be occasional mistakes despite careful proofreading.  ?    Domenick Gong, MD 11/14/20 2008

## 2022-02-13 ENCOUNTER — Encounter: Payer: Self-pay | Admitting: *Deleted

## 2022-02-13 ENCOUNTER — Emergency Department
Admission: EM | Admit: 2022-02-13 | Discharge: 2022-02-14 | Disposition: A | Discharge status: Home / Self Care | Payer: Self-pay | Attending: Emergency Medicine | Admitting: Emergency Medicine

## 2022-02-13 ENCOUNTER — Other Ambulatory Visit: Payer: Self-pay

## 2022-02-13 DIAGNOSIS — M7121 Synovial cyst of popliteal space [Baker], right knee: Secondary | ICD-10-CM | POA: Insufficient documentation

## 2022-02-13 NOTE — ED Triage Notes (Addendum)
Pt has pain in right leg from the groin area and behind the knee.  Pt states it feels like her leg is pulsating in the groin.  No known injury to leg.  Pt reports increased bruising to legs.  No chest pain or sob.  Pt alert  speech clear.

## 2022-02-14 ENCOUNTER — Emergency Department: Payer: Self-pay

## 2022-02-14 LAB — CBC
HCT: 37 % (ref 36.0–46.0)
Hemoglobin: 12.1 g/dL (ref 12.0–15.0)
MCH: 29.2 pg (ref 26.0–34.0)
MCHC: 32.7 g/dL (ref 30.0–36.0)
MCV: 89.4 fL (ref 80.0–100.0)
Platelets: 246 10*3/uL (ref 150–400)
RBC: 4.14 MIL/uL (ref 3.87–5.11)
RDW: 13.6 % (ref 11.5–15.5)
WBC: 6 10*3/uL (ref 4.0–10.5)
nRBC: 0 % (ref 0.0–0.2)

## 2022-02-14 LAB — BASIC METABOLIC PANEL
Anion gap: 7 (ref 5–15)
BUN: 10 mg/dL (ref 6–20)
CO2: 24 mmol/L (ref 22–32)
Calcium: 8.6 mg/dL — ABNORMAL LOW (ref 8.9–10.3)
Chloride: 107 mmol/L (ref 98–111)
Creatinine, Ser: 0.75 mg/dL (ref 0.44–1.00)
GFR, Estimated: >60 mL/min (ref >60)
Glucose, Bld: 98 mg/dL (ref 70–99)
Potassium: 3.6 mmol/L (ref 3.5–5.1)
Sodium: 138 mmol/L (ref 135–145)

## 2022-02-14 NOTE — ED Provider Notes (Signed)
[facility] Provider Note    Event Date/Time   First MD Initiated Contact with Patient 02/13/22 2359     (approximate)   History   Leg Pain   HPI  Stacey Hess is a 46 y.o. female who presents for evaluation of right leg pain and swelling.  Patient reports that she started having pain and swelling 2 days ago.  She reports that the pain starts in her inner thigh and radiates down behind her knee and down her leg.  She denies any prior history of PE or DVT.  She denies any trauma.  She does report having noticed some bruising that appears in her legs without any trauma that she can remember.  She denies any history of coagulopathy, she is not on any blood thinners.  She denies chest pain or shortness of breath.  She reports the pain is intermittent, throbbing and sometimes she noticed some fasciculations of the muscles of the thigh.    PMH None - reviewed  Physical Exam   Triage Vital Signs: ED Triage Vitals  Enc Vitals Group     BP 02/13/22 2322 (!) 131/3     Pulse Rate 02/13/22 2322 74     Resp 02/13/22 2322 18     Temp 02/13/22 2322 98.8 F (37.1 C)     Temp Source 02/13/22 2322 Oral     SpO2 02/13/22 2322 98 %     Weight 02/13/22 2321 142 lb (64.4 kg)     Height 02/13/22 2321 5\' 1"  (1.549 m)     Head Circumference --      Peak Flow --      Pain Score 02/13/22 2320 0     Pain Loc --      Pain Edu? --      Excl. in [place]? --     Most recent vital signs: Vitals:   02/13/22 2322 02/14/22 0400  BP: (!) 131/3 112/70  Pulse: 74 72  Resp: 18 17  Temp: 98.8 F (37.1 C)   SpO2: 98% 99%     Constitutional: Alert and oriented. Well appearing and in no apparent distress. HEENT:      Head: Normocephalic and atraumatic.         Eyes: Conjunctivae are normal. Sclera is non-icteric.       Mouth/Throat: Mucous membranes are moist.       Neck: Supple with no signs of meningismus. Cardiovascular: Regular rate and rhythm. No murmurs, gallops, or rubs.  2+ symmetrical distal pulses are present in all extremities.  Respiratory: Normal respiratory effort. Lungs are clear to auscultation bilaterally.  Gastrointestinal: Soft, non tender, and non distended with positive bowel sounds. No rebound or guarding. Genitourinary: No CVA tenderness. Musculoskeletal:  No edema, cyanosis, or erythema of extremities. Neurologic: Normal speech and language. Face is symmetric. Moving all extremities. No gross focal neurologic deficits are appreciated. Skin: Skin is warm, dry and intact. No rash noted. Psychiatric: Mood and affect are normal. Speech and behavior are normal.  ED Results / Procedures / Treatments   Labs (all labs ordered are listed, but only abnormal results are displayed) Labs Reviewed  BASIC METABOLIC PANEL - Abnormal; Notable for the following components:      Result Value   Calcium 8.6 (*)    All other components within normal limits  CBC     EKG  none   RADIOLOGY I, Rudene Re, attending MD, have personally viewed and interpreted the images obtained during this visit  as below:  Lab ultrasound with no signs of DVT   ___________________________________________________ Interpretation by Radiologist:  US Venous Img Lower Unilateral Right  Result Date: 02/14/2022 CLINICAL DATA:  Right leg pain EXAM: RIGHT LOWER EXTREMITY VENOUS DOPPLER ULTRASOUND TECHNIQUE: Gray-scale sonography with compression, as well as color and duplex ultrasound, were performed to evaluate the deep venous system(s) from the level of the common femoral vein through the popliteal and proximal calf veins. COMPARISON:  None. FINDINGS: VENOUS Normal compressibility of the common femoral, superficial femoral, and popliteal veins, as well as the visualized calf veins. Visualized portions of profunda femoral vein and great saphenous vein unremarkable. No filling defects to suggest DVT on grayscale or color Doppler imaging. Doppler waveforms show normal direction  of venous flow, normal respiratory plasticity and response to augmentation. Limited views of the contralateral common femoral vein are unremarkable. OTHER Cystic area in the popliteal fossa, which measures 2.7 x 0.7 x 1.2 cm, possibly a Baker's cyst. Limitations: none IMPRESSION: 1. Negative for DVT. 2. Cystic area in the right popliteal fossa, possibly a Baker's cyst. Electronically Signed   By: Merilyn Baba M.D.   On: 02/14/2022 02:31      PROCEDURES:  Critical Care performed: No  Procedures    IMPRESSION / MDM / ASSESSMENT AND PLAN / ED COURSE  I reviewed the triage vital signs and the nursing notes.  46 y.o. female who presents for evaluation of right leg pain and swelling.  Patient is well-appearing no distress with normal vital signs.  I do not appreciate any swelling of the right lower extremity.  She has strong palpable normal pulses on the right lower extremity.  Full painless range of motion of all joints.  No redness, erythema, or warmth.  No bruising noticed.  Ddx: DVT versus venous stasis versus Baker's cyst.  No signs of cellulitis or ischemic limb   Plan: Ultrasound venous Doppler, CBC, BMP.   MEDICATIONS GIVEN IN ED: Medications - No data to display   ED COURSE: Ultrasound negative for DVT but does show a right-sided Baker's cyst which is most likely cause of patient's pain and swelling.  Labs with normal cell lines including platelets, no significant electrolyte derangements or any signs of dehydration.  No indication for admission.  Recommended elevation and ibuprofen for pain.  Recommended follow-up with orthopedics if she continues to have persistent pain for management of the Baker's cyst.  Discussed my standard return precautions.   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   Please note:  Patient was evaluated in Emergency Department today for the symptoms described in the history of present illness. Patient was evaluated in the context of the global COVID-19 pandemic, which necessitated consideration that the patient might be at risk for infection with the SARS-CoV-2 virus that causes COVID-19. Institutional protocols and algorithms that pertain to the evaluation of patients at risk for COVID-19 are in a state of rapid change based on information released by regulatory bodies including the CDC and federal and state organizations. These policies and algorithms were followed during the patient's care in  the ED.  Some ED evaluations and interventions may be delayed as a result of limited staffing during the pandemic.       Alfred Levins, [place], MD 02/14/22 [phone]

## 2023-12-20 IMAGING — US US EXTREM LOW VENOUS*R*
1 series · 14 of 24 positions shown · non-contrast
Comparison: None.

CLINICAL DATA: Right leg pain

EXAM:
RIGHT LOWER EXTREMITY VENOUS DOPPLER ULTRASOUND
TECHNIQUE: Gray-scale sonography with compression, as well as color and duplex
ultrasound, were performed to evaluate the deep venous system(s)
from the level of the common femoral vein through the popliteal and
proximal calf veins.

[Series 1: us venous img lower uni right (dvt) · portal-venous · 14 of 39 slices shown]
[im 1/39]
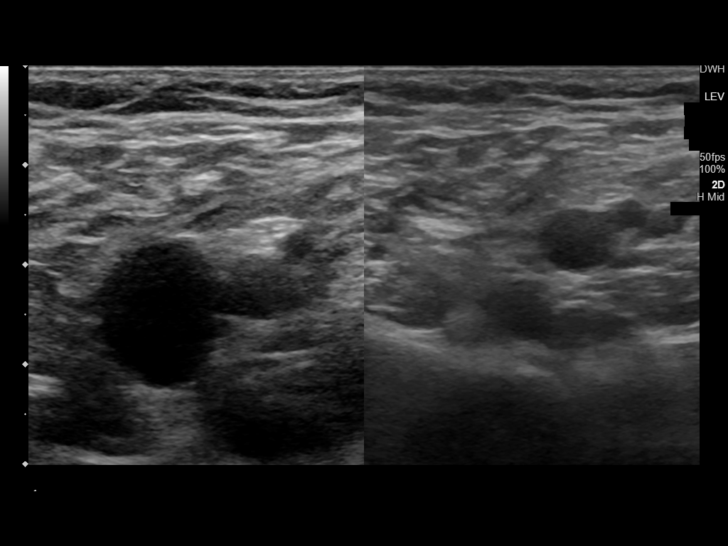
[im 4/39]
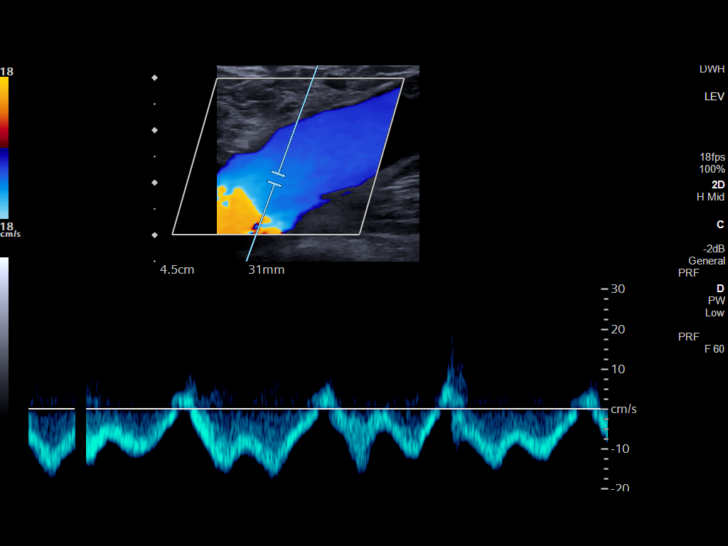
[im 7/39]
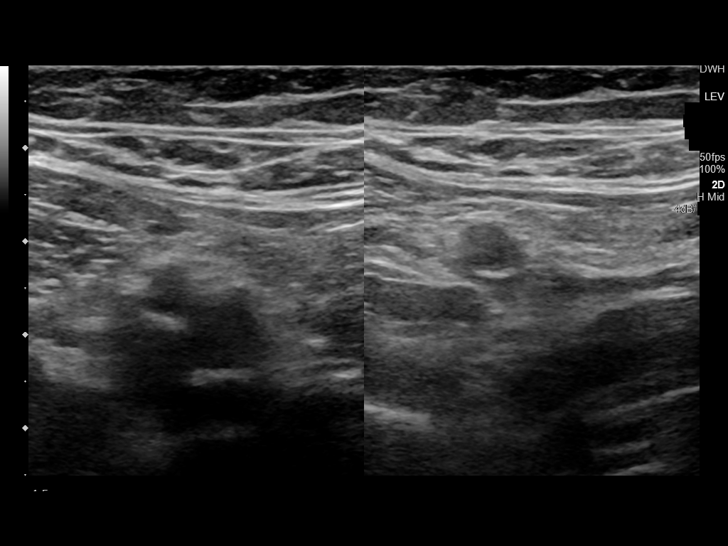
[im 10/39]
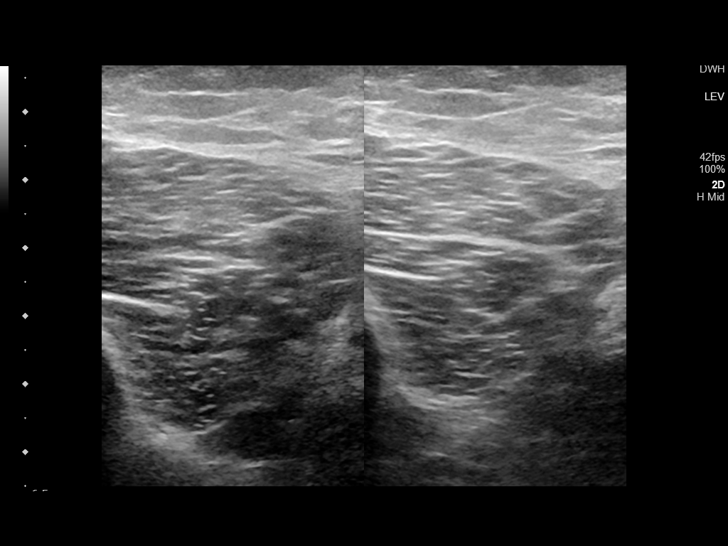
[im 12/39]
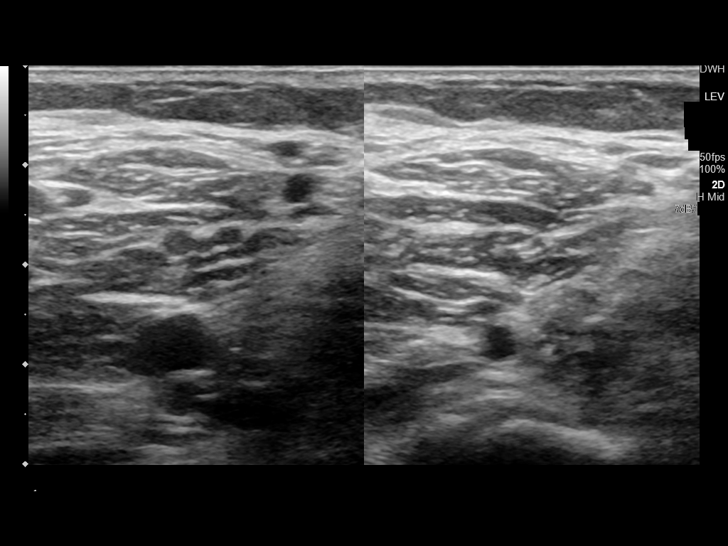
[im 15/39]
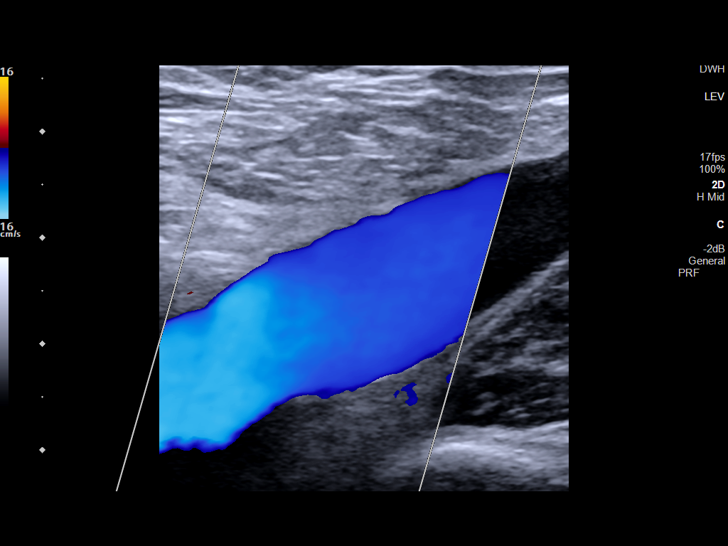
[im 19/39]
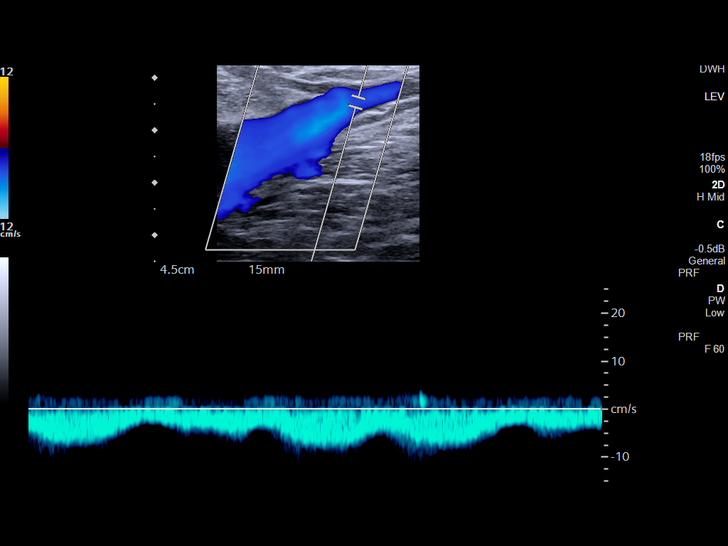
[im 20/39]
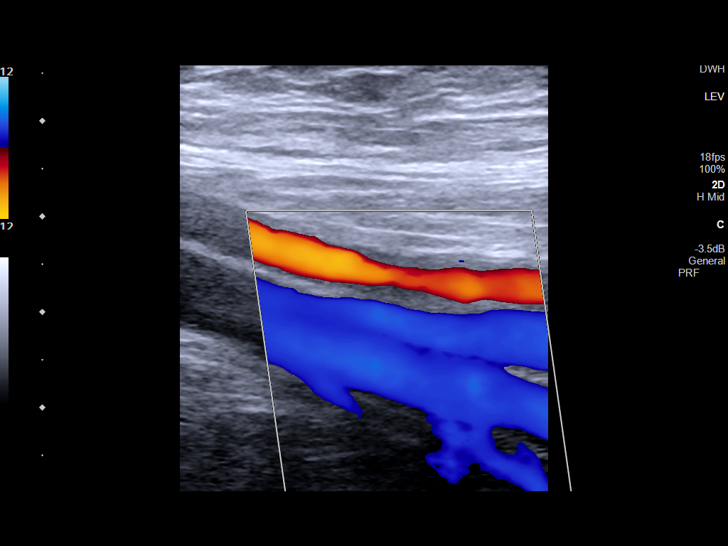
[im 24/39]
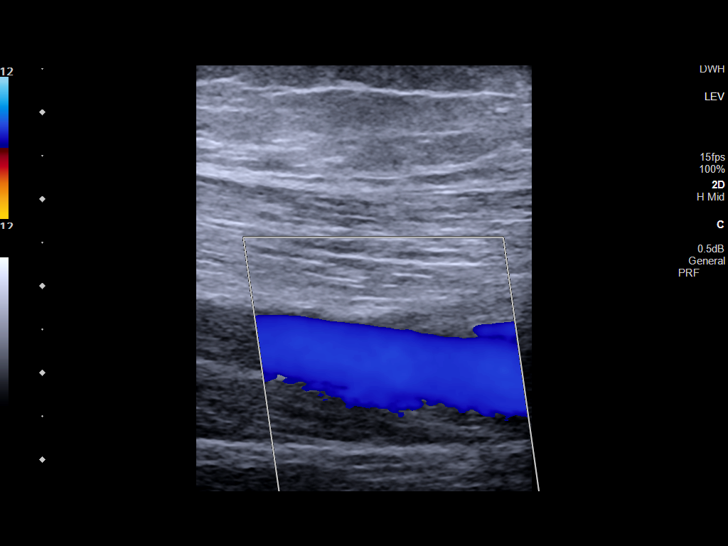
[im 27/39]
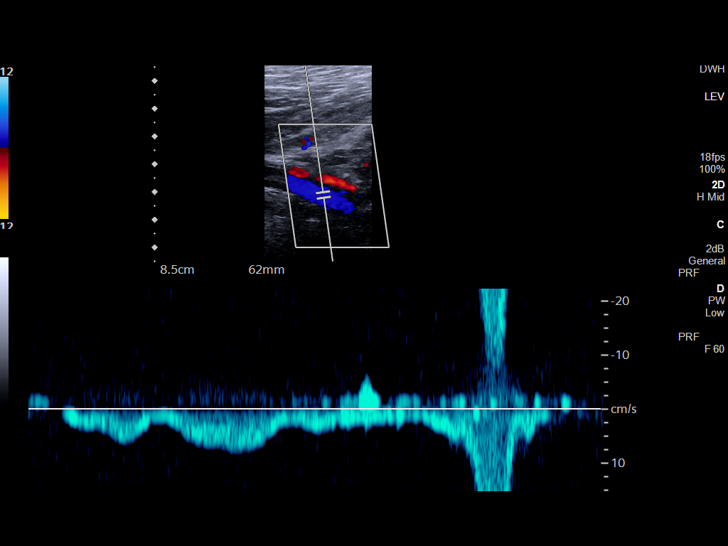
[im 30/39]
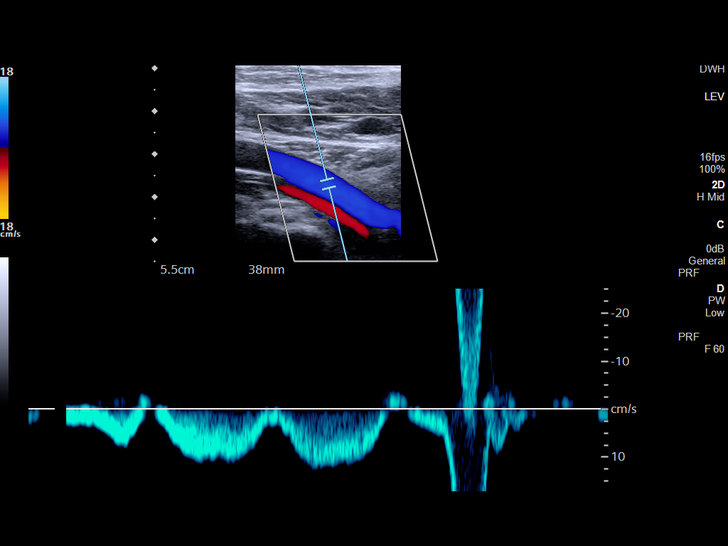
[im 32/39]
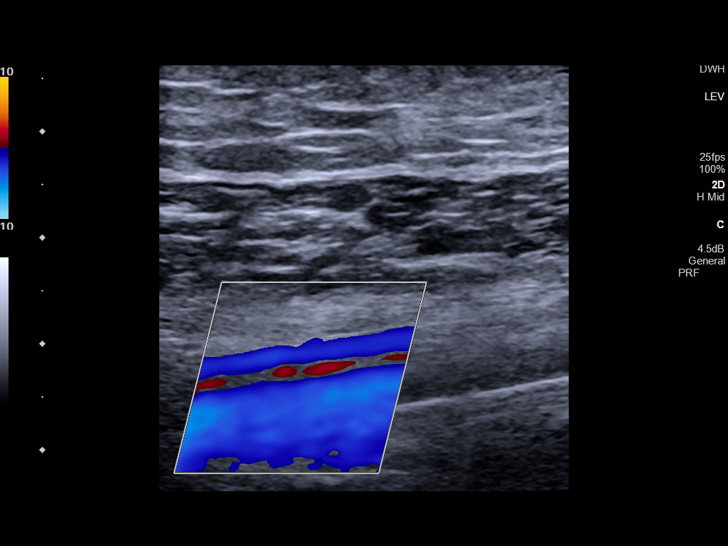
[im 35/39]
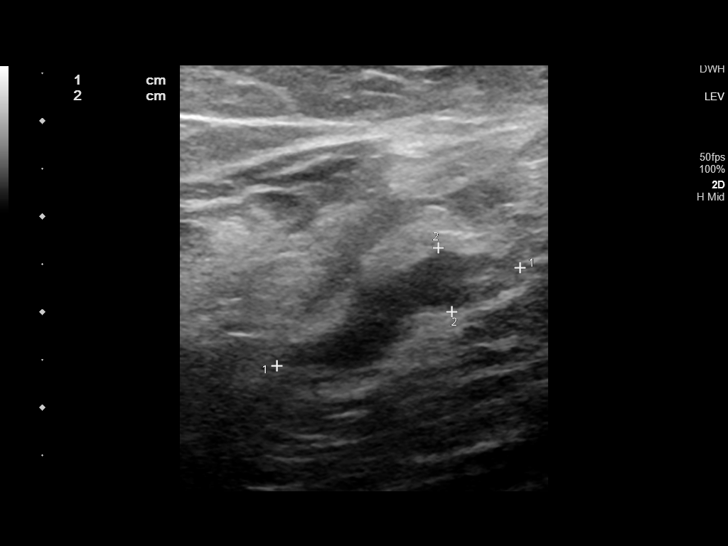
[im 39/39]
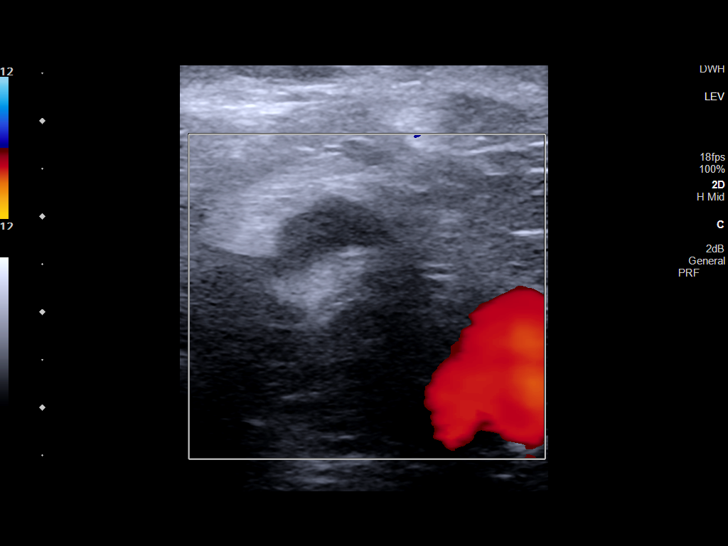

[14 of 24 positions shown; findings below may reference images not displayed]

FINDINGS: VENOUS

Normal compressibility of the common femoral, superficial femoral,
and popliteal veins, as well as the visualized calf veins.
Visualized portions of profunda femoral vein and great saphenous
vein unremarkable. No filling defects to suggest DVT on grayscale or
color Doppler imaging. Doppler waveforms show normal direction of
venous flow, normal respiratory plasticity and response to
augmentation.

Limited views of the contralateral common femoral vein are
unremarkable.

OTHER

Cystic area in the popliteal fossa, which measures 2.7 x 0.7 x
cm, possibly a Baker's cyst.

Limitations: none
IMPRESSION: 1. Negative for DVT.
2. Cystic area in the right popliteal fossa, possibly a Baker's
cyst.
